# Patient Record
Sex: Female | Born: 2000
Health system: Southern US, Community
[De-identification: ages and names within clinical notes are randomized; demographics above are authoritative.]

## PROBLEM LIST (undated history)

## (undated) DIAGNOSIS — N390 Urinary tract infection, site not specified: Secondary | ICD-10-CM

## (undated) DIAGNOSIS — R197 Diarrhea, unspecified: Secondary | ICD-10-CM

## (undated) DIAGNOSIS — N83201 Unspecified ovarian cyst, right side: Secondary | ICD-10-CM

## (undated) DIAGNOSIS — N83202 Unspecified ovarian cyst, left side: Secondary | ICD-10-CM

## (undated) HISTORY — DX: Urinary tract infection, site not specified: N39.0

## (undated) HISTORY — DX: Diarrhea, unspecified: R19.7

## (undated) HISTORY — PX: TONSILLECTOMY AND ADENOIDECTOMY: SHX28

---
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
## 2021-01-28 ENCOUNTER — Emergency Department: Admit: 2021-01-29 | Payer: PRIVATE HEALTH INSURANCE

## 2021-01-28 ENCOUNTER — Ambulatory Visit: Admit: 2021-01-28 | Discharge: 2021-01-29 | Payer: PRIVATE HEALTH INSURANCE | Attending: Physician Assistant

## 2021-01-28 DIAGNOSIS — R319 Hematuria, unspecified: Secondary | ICD-10-CM | POA: Diagnosis not present

## 2021-01-28 DIAGNOSIS — N12 Tubulo-interstitial nephritis, not specified as acute or chronic: Secondary | ICD-10-CM | POA: Diagnosis not present

## 2021-01-28 DIAGNOSIS — R5381 Other malaise: Secondary | ICD-10-CM | POA: Diagnosis not present

## 2021-01-28 DIAGNOSIS — R109 Unspecified abdominal pain: Secondary | ICD-10-CM | POA: Diagnosis not present

## 2021-01-28 DIAGNOSIS — R102 Pelvic and perineal pain: Secondary | ICD-10-CM | POA: Diagnosis not present

## 2021-01-28 DIAGNOSIS — R509 Fever, unspecified: Secondary | ICD-10-CM | POA: Diagnosis not present

## 2021-01-28 DIAGNOSIS — N3001 Acute cystitis with hematuria: Secondary | ICD-10-CM | POA: Diagnosis not present

## 2021-01-28 DIAGNOSIS — M545 Low back pain, unspecified: Secondary | ICD-10-CM | POA: Diagnosis not present

## 2021-01-28 DIAGNOSIS — R3915 Urgency of urination: Secondary | ICD-10-CM | POA: Diagnosis not present

## 2021-01-28 DIAGNOSIS — N390 Urinary tract infection, site not specified: Secondary | ICD-10-CM | POA: Diagnosis not present

## 2021-01-28 DIAGNOSIS — N3000 Acute cystitis without hematuria: Secondary | ICD-10-CM

## 2021-01-28 LAB — AMB POC URINALYSIS DIP STICK AUTO W/O MICRO
Bilirubin, Urine, POC: NEGATIVE
Glucose, Urine, POC: NEGATIVE
Nitrite, Urine, POC: POSITIVE
Specific Gravity, Urine, POC: 1.025 (ref 1.001–1.035)
Urobilinogen, POC: NORMAL
pH, Urine, POC: 6 (ref 4.6–8.0)

## 2021-01-28 LAB — POC COVID-19 & INFLUENZA COMBO (LIAT IN HOUSE)
INFLUENZA A: NOT DETECTED
INFLUENZA B: NOT DETECTED
SARS-CoV-2: NOT DETECTED

## 2021-01-28 LAB — AMB POC URINE PREGNANCY TEST, VISUAL COLOR COMPARISON: HCG, Pregnancy, Urine, POC: NEGATIVE

## 2021-01-28 MED ORDER — CIPROFLOXACIN HCL 500 MG PO TABS
500 MG | ORAL_TABLET | Freq: Two times a day (BID) | ORAL | 0 refills | Status: DC
Start: 2021-01-28 — End: 2021-01-28

## 2021-01-28 NOTE — ED Provider Notes (Signed)
[facility] EMERGENCY DEPT  EMERGENCY DEPARTMENT ENCOUNTER      Pt Name: Carrie Calderon  MRN: [id number]  Birthdate [birth year]  Date of evaluation: 01/28/2021  Provider: Jilda Roche, MD    CHIEF COMPLAINT       Chief Complaint   Patient presents with    Fever     Sent from [facility] express care for "possible kidney infection" (fever, infected urine).  Tylenol 1000 mg approx 1900, Motrin 800 mg approx 1600.         HISTORY OF PRESENT ILLNESS    Patient presents with 1 day of fevers, chills, malaise, suprapubic abdominal pain and low back pain denies any significant past medical history patient was evaluated at express care and reportedly had a markedly abnormal urinalysis and was advised to come here for further work-up for possible pyelonephritis.  Patient is well-appearing at the time my evaluation with no current complaints other than chills.        Nursing Notes were reviewed.    REVIEW OF SYSTEMS     Review of Systems   Constitutional:  Positive for chills and fever. Negative for activity change and appetite change.   HENT:  Negative for congestion, rhinorrhea, sinus pain, sore throat and trouble swallowing.    Eyes:  Negative for photophobia, pain, discharge, redness and visual disturbance.   Respiratory:  Negative for cough, chest tightness and shortness of breath.    Cardiovascular:  Negative for chest pain, palpitations and leg swelling.   Gastrointestinal:  Positive for abdominal pain. Negative for abdominal distention, constipation, diarrhea, nausea and vomiting.   Endocrine: Negative for polydipsia and polyuria.   Genitourinary:  Positive for urgency. Negative for difficulty urinating, dysuria and frequency.   Musculoskeletal:  Negative for arthralgias, neck pain and neck stiffness.   Skin:  Negative for color change, pallor and rash.   Neurological:  Negative for seizures, light-headedness and headaches.   Psychiatric/Behavioral:  Negative for confusion, dysphoric mood and hallucinations.    All other systems  reviewed and are negative.    Except as noted above the remainder of the review of systems was reviewed and negative.     PAST MEDICAL HISTORY   History reviewed. No pertinent past medical history.    SURGICAL HISTORY       Past Surgical History:   Procedure Laterality Date    TONSILLECTOMY      WISDOM TOOTH EXTRACTION         CURRENT MEDICATIONS       Current Discharge Medication List        CONTINUE these medications which have NOT CHANGED    Details   ciprofloxacin (CIPRO) 500 MG tablet Take 1 tablet by mouth 2 times daily for 7 days  Qty: 14 tablet, Refills: 0    Associated Diagnoses: Urinary tract infection with hematuria, site unspecified; Low back pain without sciatica, unspecified back pain laterality, unspecified chronicity; Hematuria, unspecified type             ALLERGIES     Azithromycin and Cephalexin    FAMILY HISTORY     No family history on file.     SOCIAL HISTORY       Social History     Socioeconomic History    Marital status: Single     Spouse name: None    Number of children: None    Years of education: None    Highest education level: None   Tobacco Use    Smoking status: Never  Smokeless tobacco: Never   Vaping Use    Vaping Use: Former       Soil scientist Coma Scale  Eye Opening: Spontaneous  Best Verbal Response: Oriented  Best Motor Response: Obeys commands  Gl[place] Coma Scale Score: 15                     CIWA Assessment  BP: 130/77  Heart Rate: 77                 PHYSICAL EXAM       ED Triage Vitals   BP Temp Temp Source Heart Rate Resp SpO2 Height Weight   01/28/21 2002 01/28/21 2002 01/28/21 2002 01/28/21 2002 01/28/21 2002 01/28/21 2002 01/28/21 2006 01/28/21 2006   125/74 (!) 102.6 ??F (39.2 ??C) Oral 87 20 98 % 5\' 2"  (1.575 m) 143 lb 3.2 oz (65 kg)       Physical Exam  Vitals and nursing note reviewed.   Constitutional:       General: She is not in acute distress.     Appearance: Normal appearance. She is not ill-appearing.   HENT:      Head: Normocephalic and  atraumatic.      Mouth/Throat:      Mouth: Mucous membranes are moist.      Pharynx: Oropharynx is clear. No oropharyngeal exudate or posterior oropharyngeal erythema.   Eyes:      Extraocular Movements: Extraocular movements intact.      Pupils: Pupils are equal, round, and reactive to light.   Cardiovascular:      Rate and Rhythm: Normal rate and regular rhythm.      Pulses: Normal pulses.      Heart sounds: Normal heart sounds.   Pulmonary:      Effort: Pulmonary effort is normal. No respiratory distress.      Breath sounds: Normal breath sounds.   Abdominal:      General: Bowel sounds are normal.      Palpations: Abdomen is soft.      Tenderness: There is no abdominal tenderness.   Musculoskeletal:         General: No swelling or tenderness. Normal range of motion.      Cervical back: Normal range of motion and neck supple.   Skin:     General: Skin is warm and dry.      Coloration: Skin is not jaundiced.      Findings: No rash.   Neurological:      General: No focal deficit present.      Mental Status: She is alert and oriented to person, place, and time.   Psychiatric:         Mood and Affect: Mood normal.         Behavior: Behavior normal.         Thought Content: Thought content normal.       Procedures    DIAGNOSTIC RESULTS     EKG: All EKG's are interpreted by the Emergency Department Physician who either signs or Co-signs this chart in the absence of a cardiologist.    RADIOLOGY:   CT ABDOMEN PELVIS WO CONTRAST Additional Contrast? None   Final Result   No acute findings by noncontrast technique.  No urinary calculi or    hydronephrosis.          LABS:  Labs Reviewed   COMPREHENSIVE METABOLIC PANEL - Abnormal; Notable for the following  components:       Result Value    Sodium 133 (*)     OSMOLALITY CALCULATED 266 (*)     All other components within normal limits   CBC WITH AUTO DIFFERENTIAL - Abnormal; Notable for the following components:    Neutrophils % 76.6 (*)     Lymphocytes 13.0 (*)     Neutrophils  Absolute 7.9 (*)     All other components within normal limits   CULTURE, URINE   LACTIC ACID   URINALYSIS W/ RFLX MICROSCOPIC   POC PREGNANCY UR-QUAL       All other labs were within normal range or not returned as of this dictation.    EMERGENCY DEPARTMENT COURSE/REASSESSMENT and MDM:   MDM  Number of Diagnoses or Management Options  Hematuria, unspecified type: new and does not require workup  Low back pain without sciatica, unspecified back pain laterality, unspecified chronicity: new and does not require workup  Urinary tract infection with hematuria, site unspecified: new and does not require workup     Amount and/or Complexity of Data Reviewed  Clinical lab tests: reviewed and ordered  Tests in the radiology section of CPT??: reviewed and ordered    Patient Progress  Patient progress: improved           FINAL IMPRESSION      1. Acute cystitis without hematuria    2. Urinary tract infection with hematuria, site unspecified    3. Low back pain without sciatica, unspecified back pain laterality, unspecified chronicity    4. Hematuria, unspecified type          DISPOSITION/PLAN   DISPOSITION Decision To Discharge 01/28/2021 10:36:38 PM      PATIENT REFERRED TO:  Your PCP or [phone]  Call your primary care doctor for follow-up or if you do not have a primary care doctor you can call [phone] for assistance scheduling a primary care follow-up appointment  Call in 1 week        DISCHARGE MEDICATIONS:  Current Discharge Medication List        Controlled Substances Monitoring:     No flowsheet data found.    (Please note that portions of this note were completed with a voice recognition program.  Efforts were made to edit the dictations but occasionally words are mis-transcribed.)    Jilda Roche, MD (electronically signed)  Attending Emergency Physician            Jilda Roche, MD  01/29/21 [phone]

## 2021-01-28 NOTE — Progress Notes (Signed)
Carrie Calderon (DOB:  07-02-2000) is a 20 y.o. female,here for evaluation of the following chief complaint(s):  Fatigue, Chills, and Fever             Subjective   SUBJECTIVE/OBJECTIVE:     Patient presents with a 5 day history of worsening burning with urination, mid back pain, fever, and vomiting. Patient states she has not been able to eat or drink in the past 3-4 days. Patient reports Tmax of 104 today despite Tylenol and Motrin. Symptoms include pain on urination and mild abdominal pain limited to the suprapubic region. Patient admits to chills, rigors, and abdominal discomfort. Patient states that she was hospitalized as a child with pyelonephritis. They know of no exacerbating or alleviating factors.         Fatigue  Associated symptoms: fatigue and fever    Fever     Past Medical History  No past medical history on file.    No scans are attached to the encounter.   Review of Systems   Constitutional:  Positive for fatigue and fever.        Objective   Physical Exam  Vitals and nursing note reviewed.   Constitutional:       Appearance: Normal appearance. She is obese.   HENT:      Head: Normocephalic and atraumatic.   Cardiovascular:      Rate and Rhythm: Normal rate and regular rhythm.   Pulmonary:      Effort: Pulmonary effort is normal.      Breath sounds: Normal breath sounds.   Abdominal:      General: Bowel sounds are normal. There is no distension.      Palpations: Abdomen is soft. There is no mass.      Tenderness: There is abdominal tenderness. There is left CVA tenderness and guarding.   Musculoskeletal:      Cervical back: Normal range of motion and neck supple.   Neurological:      General: No focal deficit present.      Mental Status: She is alert and oriented to person, place, and time.   Psychiatric:         Mood and Affect: Mood normal.         Behavior: Behavior normal.         Thought Content: Thought content normal.            Results for orders placed or performed in visit on 01/28/21    POC COVID-19 & Influenza Combo ([facility])   Result Value Ref Range    SARS-CoV-2 Not Detected Not Detected    INFLUENZA A Not Detected Not Detected    INFLUENZA B Not Detected Not Detected    Narrative    Is this test for diagnosis or screening?->Diagnosis of ill patient  Symptomatic for COVID-19 as defined by CDC?->Yes  Date of Symptom Onset->01/24/21  Hospitalized for COVID-19?->No  Admitted to ICU for COVID-19?->No  Employed in healthcare setting?->Unknown  Resident in a congregate (group) care setting?->Unknown  Pregnant?->Unknown   POC Urinalysis, Auto W/O Scope (81003)   Result Value Ref Range    Color (UA POC) Orange     Clarity (UA POC) Cloudy     Glucose, Urine, POC Negative Negative    Bilirubin, Urine, POC Negative Negative    Ketones, Urine, POC Small Negative    Specific Gravity, Urine, POC 1.025 1.001 - 1.035    Blood (UA POC) Small Negative    pH, Urine, POC  6.0 4.6 - 8.0    Protein, Urine, POC 2+ Negative    Urobilinogen, POC Normal     Nitrite, Urine, POC Positive Negative    Leukocyte Esterase, Urine, POC Moderate Negative   POC Urine Pregnancy Test (07371)   Result Value Ref Range    Valid Internal Control, POC Present     HCG, Pregnancy, Urine, POC Negative Negative        ASSESSMENT/PLAN:    1. Fever, unspecified fever cause  -     POC COVID-19 & Influenza Combo ([facility])  -     POC Urinalysis, Auto W/O Scope (81003)  -     Culture, Urine  -     acetaminophen (TYLENOL) tablet 975 mg; 975 mg (rounded from 1,000 mg), Oral, ONCE, 1 dose, On Mon 01/28/21 at dose of acetaminophen is 4000 mg from all sources in 24 hours.  2. Urinary tract infection with hematuria, site unspecified  -     ciprofloxacin (CIPRO) 500 MG tablet; Take 1 tablet by mouth 2 times daily for 7 days, Disp-14 tablet, R-0Normal  3. Low back pain without sciatica, unspecified back pain laterality, unspecified chronicity  -     ciprofloxacin (CIPRO) 500 MG tablet; Take 1 tablet by mouth 2 times daily for 7  days, Disp-14 tablet, R-0Normal  4. Hematuria, unspecified type  -     ciprofloxacin (CIPRO) 500 MG tablet; Take 1 tablet by mouth 2 times daily for 7 days, Disp-14 tablet, R-0Normal  -     POC Urine Pregnancy Test (06269)  5. Pyelonephritis      Return today (on 01/28/2021), or today at [facility].       Orders Placed This Encounter   Medications    ciprofloxacin (CIPRO) 500 MG tablet     Sig: Take 1 tablet by mouth 2 times daily for 7 days     Dispense:  14 tablet     Refill:  0    acetaminophen (TYLENOL) tablet 975 mg      Patient to go to [facility] ER today POV.     I have spoken with the patient and/or caregivers. I have explained the patient's condition, diagnoses and treatment plan based on the information available to me at this time. I have answered the patient's and/or caregiver's questions and addressed any concerns. The patient and/or caregivers have as good an understanding of the patient's diagnosis, condition and treatment plan as can be expected at this point. The vital signs have been stable. The patient's condition is stable and appropriate for discharge from the express care The patient will pursue further outpatient evaluation with the primary care physician or other designated or consulting physician as outlined in the discharge instructions. The patient and/or caregivers are agreeable to this plan of care and follow-up instructions have been explained in detail. The patient and/or caregivers are aware that any significant change in condition or worsening of symptoms should prompt an immediate return to this office or the closest emergency department or a call to 911 if sx worsen even after discharge from the emergency department         An electronic signature was used to authenticate this note.    --Paul Half, DMSc, PA-C, FAAPA

## 2021-01-28 NOTE — Other (Signed)
Patient informed.

## 2021-01-28 NOTE — ED Notes (Signed)
Patient's vital signs were recently assessed and found to be at or close to baseline, Within Normal Limits. Patient ambulated to the lobby without assistance. Patient was in NO acute distress and was stable on discharge. Patient understood discharge instructions and did not have any further questions.        Mauricia Area, RN  01/28/21 [phone]

## 2021-01-29 ENCOUNTER — Inpatient Hospital Stay
Admit: 2021-01-29 | Discharge: 2021-01-29 | Disposition: A | Discharge status: Home / Self Care | Payer: PRIVATE HEALTH INSURANCE | Attending: Emergency Medicine

## 2021-01-29 LAB — URINALYSIS W/ RFLX MICROSCOPIC
Bilirubin Urine: NEGATIVE
Glucose, UA: NEGATIVE
Ketones, Urine: 40 mg/dL — AB
Nitrite, Urine: POSITIVE — AB
Protein, UA: 30 — AB
Specific Gravity, UA: >=1.03 — AB (ref 1.003–1.035)
Urobilinogen, Urine: 0.2 EU/dL
pH, UA: 6 (ref 4.5–8.0)

## 2021-01-29 LAB — CBC WITH AUTO DIFFERENTIAL
Absolute Baso #: 0 10*3/uL (ref 0.0–0.2)
Absolute Eos #: 0 10*3/uL (ref 0.0–0.5)
Absolute Lymph #: 1.3 10*3/uL (ref 1.0–3.2)
Absolute Mono #: 1 10*3/uL (ref 0.3–1.0)
Basophils %: 0.3 % (ref 0.0–2.0)
Eosinophils %: 0 % (ref 0.0–7.0)
Hematocrit: 36.9 % (ref 34.0–47.0)
Hemoglobin: 12.6 g/dL (ref 11.5–15.7)
Immature Grans (Abs): 0.03 10*3/uL (ref 0.00–0.06)
Immature Granulocytes: 0.3 % (ref 0.0–0.6)
Lymphocytes: 13 % — ABNORMAL LOW (ref 15.0–45.0)
MCH: 29.6 pg (ref 27.0–34.5)
MCHC: 34.1 g/dL (ref 32.0–36.0)
MCV: 86.8 fL (ref 81.0–99.0)
MPV: 10.3 fL (ref 7.2–13.2)
Monocytes: 9.8 % (ref 4.0–12.0)
NRBC Absolute: 0 10*3/uL (ref 0.000–0.012)
NRBC Automated: 0 % (ref 0.0–0.2)
Neutrophils %: 76.6 % — ABNORMAL HIGH (ref 42.0–74.0)
Neutrophils Absolute: 7.9 10*3/uL — ABNORMAL HIGH (ref 1.6–7.3)
Platelets: 192 10*3/uL (ref 140–440)
RBC: 4.25 x10e6/mcL (ref 3.60–5.20)
RDW: 12.7 % (ref 11.0–16.0)
WBC: 10.3 10*3/uL (ref 3.8–10.6)

## 2021-01-29 LAB — MICROSCOPIC URINALYSIS

## 2021-01-29 LAB — COMPREHENSIVE METABOLIC PANEL
ALT: 14 U/L (ref 0–35)
AST: 15 U/L (ref 0–35)
Albumin/Globulin Ratio: 1.29 (ref 1.00–2.70)
Albumin: 4 g/dL (ref 3.5–5.2)
Alk Phosphatase: 49 U/L (ref 35–117)
Anion Gap: 13 mmol/L (ref 2–17)
BUN: 11 mg/dL (ref 6–20)
CO2: 22 mmol/L (ref 22–29)
Calcium: 9 mg/dL (ref 8.6–10.0)
Chloride: 98 mmol/L (ref 98–107)
Creatinine: 0.9 mg/dL (ref 0.5–1.0)
Est, Glom Filt Rate: 94 mL/min/1.73m?? (ref >=90)
Globulin: 3.1 g/dL (ref 1.9–4.4)
Glucose: 97 mg/dL (ref 70–99)
OSMOLALITY CALCULATED: 266 mOsm/kg — ABNORMAL LOW (ref 270–287)
Potassium: 3.6 mmol/L (ref 3.5–5.3)
Sodium: 133 mmol/L — ABNORMAL LOW (ref 135–145)
Total Bilirubin: 0.5 mg/dL (ref 0.00–1.20)
Total Protein: 7.1 g/dL (ref 6.4–8.3)

## 2021-01-29 LAB — CULTURE, URINE: FINAL REPORT: 6000

## 2021-01-29 LAB — LACTIC ACID: Lactic Acid: 0.9 mmol/L (ref 0.5–2.0)

## 2021-01-29 MED ORDER — SODIUM CHLORIDE 0.9 % IV BOLUS
0.9 % | Freq: Once | INTRAVENOUS | Status: AC
Start: 2021-01-29 — End: 2021-01-28
  Administered 2021-01-29: 03:00:00 1000 mL via INTRAVENOUS

## 2021-01-29 MED ORDER — CIPROFLOXACIN HCL 500 MG PO TABS
500 MG | ORAL_TABLET | Freq: Two times a day (BID) | ORAL | 0 refills | Status: AC
Start: 2021-01-29 — End: 2021-02-04

## 2021-01-29 MED ORDER — CIPROFLOXACIN IN D5W 400 MG/200ML IV SOLN
400 MG/200ML | Freq: Once | INTRAVENOUS | Status: AC
Start: 2021-01-29 — End: 2021-01-28
  Administered 2021-01-29: 03:00:00 400 mg via INTRAVENOUS

## 2021-01-29 MED ORDER — ACETAMINOPHEN 325 MG PO TABS
325 MG | Freq: Once | ORAL | Status: AC
Start: 2021-01-29 — End: 2021-01-28
  Administered 2021-01-29: 01:00:00 975 mg via ORAL

## 2021-01-29 MED FILL — CIPROFLOXACIN IN D5W 400 MG/200ML IV SOLN: 400 MG/200ML | INTRAVENOUS | Qty: 200

## 2021-01-29 MED FILL — SODIUM CHLORIDE 0.9 % IV SOLN: 0.9 % | INTRAVENOUS | Qty: 1000

## 2021-01-30 LAB — CULTURE, URINE: FINAL REPORT: >100000 — AB

## 2021-03-08 DIAGNOSIS — F329 Major depressive disorder, single episode, unspecified: Secondary | ICD-10-CM | POA: Diagnosis not present

## 2021-03-22 DIAGNOSIS — F411 Generalized anxiety disorder: Secondary | ICD-10-CM | POA: Diagnosis not present

## 2021-04-03 DIAGNOSIS — F411 Generalized anxiety disorder: Secondary | ICD-10-CM | POA: Diagnosis not present

## 2021-07-22 DIAGNOSIS — F4325 Adjustment disorder with mixed disturbance of emotions and conduct: Secondary | ICD-10-CM | POA: Diagnosis not present

## 2021-07-25 DIAGNOSIS — F4325 Adjustment disorder with mixed disturbance of emotions and conduct: Secondary | ICD-10-CM | POA: Diagnosis not present

## 2021-08-07 DIAGNOSIS — F4325 Adjustment disorder with mixed disturbance of emotions and conduct: Secondary | ICD-10-CM | POA: Diagnosis not present

## 2021-08-21 DIAGNOSIS — F4325 Adjustment disorder with mixed disturbance of emotions and conduct: Secondary | ICD-10-CM | POA: Diagnosis not present

## 2021-09-04 DIAGNOSIS — F4325 Adjustment disorder with mixed disturbance of emotions and conduct: Secondary | ICD-10-CM | POA: Diagnosis not present

## 2021-09-11 DIAGNOSIS — F4325 Adjustment disorder with mixed disturbance of emotions and conduct: Secondary | ICD-10-CM | POA: Diagnosis not present

## 2021-09-26 DIAGNOSIS — F4325 Adjustment disorder with mixed disturbance of emotions and conduct: Secondary | ICD-10-CM | POA: Diagnosis not present

## 2021-10-16 ENCOUNTER — Telehealth: Payer: 59 | Admitting: Physician Assistant

## 2021-10-16 DIAGNOSIS — B9689 Other specified bacterial agents as the cause of diseases classified elsewhere: Secondary | ICD-10-CM

## 2021-10-16 NOTE — Progress Notes (Signed)
[place] 

## 2021-11-27 DIAGNOSIS — R5383 Other fatigue: Secondary | ICD-10-CM | POA: Diagnosis not present

## 2021-11-27 DIAGNOSIS — R509 Fever, unspecified: Secondary | ICD-10-CM | POA: Diagnosis not present

## 2021-11-27 DIAGNOSIS — R059 Cough, unspecified: Secondary | ICD-10-CM | POA: Diagnosis not present

## 2021-11-27 DIAGNOSIS — R0981 Nasal congestion: Secondary | ICD-10-CM | POA: Diagnosis not present

## 2022-03-19 ENCOUNTER — Telehealth: Payer: Self-pay | Admitting: Internal Medicine

## 2022-03-19 NOTE — Telephone Encounter (Signed)
Pt's mom is requesting TOC. Please advise.

## 2022-03-19 NOTE — Telephone Encounter (Signed)
OK 

## 2022-03-20 NOTE — Telephone Encounter (Signed)
Fine with me

## 2022-03-21 NOTE — Telephone Encounter (Signed)
Pt's mom will call back after looking at her sched.

## 2023-01-14 ENCOUNTER — Encounter
Admit: 2023-01-14 | Payer: PRIVATE HEALTH INSURANCE | Admitting: Student in an Organized Health Care Education/Training Program | Primary: Student in an Organized Health Care Education/Training Program

## 2023-01-14 ENCOUNTER — Other Ambulatory Visit
Admit: 2023-01-14 | Discharge: 2023-01-15 | Disposition: A | Discharge status: Home / Self Care | Payer: PRIVATE HEALTH INSURANCE | Source: Ambulatory Visit | Attending: Student in an Organized Health Care Education/Training Program | Admitting: Student in an Organized Health Care Education/Training Program | Primary: Student in an Organized Health Care Education/Training Program

## 2023-01-14 VITALS — BP 112/82 | HR 95 | Resp 16 | Ht 62.0 in | Wt 153.1 lb

## 2023-01-14 VITALS — BP 118/70 | Ht 62.0 in | Wt 152.0 lb

## 2023-01-14 DIAGNOSIS — Z124 Encounter for screening for malignant neoplasm of cervix: Secondary | ICD-10-CM | POA: Diagnosis not present

## 2023-01-14 DIAGNOSIS — Z01419 Encounter for gynecological examination (general) (routine) without abnormal findings: Secondary | ICD-10-CM

## 2023-01-14 DIAGNOSIS — J329 Chronic sinusitis, unspecified: Secondary | ICD-10-CM

## 2023-01-14 LAB — CBC WITH AUTO DIFFERENTIAL
Basophils %: 0.7 % (ref 0.0–2.0)
Basophils Absolute: 0 10*3/uL (ref 0.0–0.2)
Eosinophils %: 2.4 % (ref 0.0–7.0)
Eosinophils Absolute: 0.1 10*3/uL (ref 0.0–0.5)
Hematocrit: 39.1 % (ref 34.0–47.0)
Hemoglobin: 13.2 g/dL (ref 11.5–15.7)
Immature Grans (Abs): 0.02 10*3/uL (ref 0.00–0.06)
Immature Granulocytes %: 0.3 % (ref 0.0–0.6)
Lymphocytes Absolute: 2.3 10*3/uL (ref 1.0–3.2)
Lymphocytes: 39.9 % (ref 15.0–45.0)
MCH: 28.7 pg (ref 27.0–34.5)
MCHC: 33.8 g/dL (ref 30.0–36.0)
MCV: 85 fL (ref 81.0–99.0)
MPV: 9.5 fL (ref 7.0–12.2)
Monocytes %: 6 % (ref 4.0–12.0)
Monocytes Absolute: 0.4 10*3/uL (ref 0.3–1.0)
NRBC Absolute: 0 10*3/uL (ref 0.000–0.012)
NRBC Automated: 0 % (ref 0.0–0.2)
Neutrophils %: 50.7 % (ref 42.0–74.0)
Neutrophils Absolute: 3 10*3/uL (ref 1.6–7.3)
Platelets: 252 10*3/uL (ref 140–440)
RBC: 4.6 x10e6/mcL (ref 3.60–5.20)
RDW: 11.9 % (ref 10.0–17.0)
WBC: 5.9 10*3/uL (ref 3.8–10.6)

## 2023-01-14 LAB — COMPREHENSIVE METABOLIC PANEL
ALT: 21 U/L (ref 0–42)
AST: 20 U/L (ref 0–46)
Albumin/Globulin Ratio: 2.11 (ref 1.00–2.70)
Albumin: 4.6 g/dL (ref 3.5–5.2)
Alk Phosphatase: 53 U/L (ref 35–117)
Anion Gap: 9 mmol/L (ref 2–17)
BUN: 16 mg/dL (ref 6–20)
CO2: 25 mmol/L (ref 22–29)
Calcium: 10 mg/dL (ref 8.5–10.7)
Chloride: 106 mmol/L (ref 98–107)
Creatinine: 0.9 mg/dL (ref 0.5–1.0)
Est, Glom Filt Rate: 93 mL/min/1.73mÂ² (ref >=60)
Globulin: 2.2 g/dL (ref 1.9–4.4)
Glucose: 90 mg/dL (ref 70–99)
Osmolaliy Calculated: 280 mosm/kg (ref 270–287)
Potassium: 4.2 mmol/L (ref 3.5–5.3)
Sodium: 140 mmol/L (ref 135–145)
Total Bilirubin: 0.56 mg/dL (ref 0.00–1.20)
Total Protein: 6.8 g/dL (ref 5.7–8.3)

## 2023-01-14 LAB — HEMOGLOBIN A1C
Estimated Avg Glucose: 94
Estimated Avg Glucose: 97
Hemoglobin A1C: 4.9 % (ref 4.0–6.0)

## 2023-01-14 LAB — LIPID PANEL
Chol/HDL Ratio: 2.2 (ref 0.0–4.4)
Cholesterol, Total: 138 mg/dL (ref 100–200)
HDL: 64 mg/dL (ref >=50)
LDL Cholesterol: 63 mg/dL (ref 0.0–100.0)
LDL/HDL Ratio: 1
Triglycerides: 56 mg/dL (ref 0–149)
VLDL: 11.2 mg/dL (ref 5.0–40.0)

## 2023-01-14 MED ORDER — AZELASTINE HCL 0.1 % NA SOLN
0.1 | Freq: Two times a day (BID) | NASAL | 2 refills | Status: AC
Start: 2023-01-14 — End: ?

## 2023-01-14 NOTE — Progress Notes (Signed)
 Subjective:       Chief Complaint: Carrie Calderon is a 22 y.o.female who presents with   Chief Complaint   Patient presents with    New Patient     Pt is present in office today for a new pt appointment. Pt has had bronchitis and still has not fully cle

## 2023-01-14 NOTE — Progress Notes (Signed)
 Annual Gynecologic Exam    Patient presents today for a routine gynecological examination.        01/14/2023     8:16 AM   PHQ-9    Little interest or pleasure in doing things 0   Feeling down, depressed, or hopeless 0   PHQ-2 Score 0   PHQ-9 Total Score

## 2023-03-05 NOTE — Telephone Encounter (Signed)
-----   Message from Dr. Josepha Pigg, MD sent at 01/14/2023  8:32 AM EST -----  Can you please obtain PA for this patient to complete the gardasil series? She has received one vaccine previously but still needs the remaining two. Thanks!

## 2023-03-05 NOTE — Telephone Encounter (Signed)
*  Gardasil/Injections is subject to the patients $3,300.00 Deductible, which $0.00 has been met and then covered at 80% and her $4,000.00 Out of Pocket, which $0.00 has been met than then covered at 100% of the allowable. Patient will be 100% responsible for ALL charges due in FULL at the time of service.  1st Gardasil Inj. - $590.20, 2nd Gardasil Inj. - $563.68 & 3rd Gardasil Inj. - $161.09 for each shot. No Prior Authorization required.  There is an Age Restriction of 4 - 42 Years of Age.  Spoke w/ Lyda Perone.   Ref. # [id number]

## 2023-03-10 NOTE — Telephone Encounter (Signed)
Patient doesn't have voice mail. Unable to leave message

## 2023-03-11 NOTE — Telephone Encounter (Signed)
LVM of gardasil benefits and to call the office with any questions

## 2024-01-14 ENCOUNTER — Encounter
Payer: PRIVATE HEALTH INSURANCE | Attending: Student in an Organized Health Care Education/Training Program | Primary: Student in an Organized Health Care Education/Training Program
